# Patient Record
Sex: Female | Born: 1950 | Race: Black or African American | Hispanic: No | Marital: Single | Smoking: Never smoker
Health system: Southern US, Community
[De-identification: ages and names within clinical notes are randomized; demographics above are authoritative.]

## PROBLEM LIST (undated history)

## (undated) HISTORY — PX: ANKLE ARTHROSCOPY WITH OPEN REDUCTION INTERNAL FIXATION (ORIF): SHX5582

---

## 2003-10-04 ENCOUNTER — Emergency Department (HOSPITAL_COMMUNITY): Admission: EM | Admit: 2003-10-04 | Discharge: 2003-10-04 | Payer: Self-pay | Admitting: Family Medicine

## 2004-05-18 ENCOUNTER — Emergency Department (HOSPITAL_COMMUNITY): Admission: EM | Admit: 2004-05-18 | Discharge: 2004-05-18 | Payer: Self-pay | Admitting: Emergency Medicine

## 2005-08-19 ENCOUNTER — Emergency Department (HOSPITAL_COMMUNITY): Admission: EM | Admit: 2005-08-19 | Discharge: 2005-08-19 | Payer: Self-pay | Admitting: Emergency Medicine

## 2005-09-11 ENCOUNTER — Emergency Department (HOSPITAL_COMMUNITY): Admission: EM | Admit: 2005-09-11 | Discharge: 2005-09-11 | Payer: Self-pay | Admitting: Family Medicine

## 2006-10-17 ENCOUNTER — Emergency Department (HOSPITAL_COMMUNITY): Admission: EM | Admit: 2006-10-17 | Discharge: 2006-10-17 | Payer: Self-pay | Admitting: Emergency Medicine

## 2006-10-18 ENCOUNTER — Emergency Department (HOSPITAL_COMMUNITY): Admission: EM | Admit: 2006-10-18 | Discharge: 2006-10-18 | Payer: Self-pay | Admitting: Family Medicine

## 2007-05-09 ENCOUNTER — Emergency Department (HOSPITAL_COMMUNITY): Admission: EM | Admit: 2007-05-09 | Discharge: 2007-05-09 | Payer: Self-pay | Admitting: Emergency Medicine

## 2007-05-13 ENCOUNTER — Observation Stay (HOSPITAL_COMMUNITY): Admission: RE | Admit: 2007-05-13 | Discharge: 2007-05-14 | Payer: Self-pay | Admitting: Orthopaedic Surgery

## 2007-12-02 ENCOUNTER — Ambulatory Visit: Payer: Self-pay | Admitting: Family Medicine

## 2007-12-02 DIAGNOSIS — G43009 Migraine without aura, not intractable, without status migrainosus: Secondary | ICD-10-CM | POA: Insufficient documentation

## 2007-12-02 DIAGNOSIS — I10 Essential (primary) hypertension: Secondary | ICD-10-CM | POA: Insufficient documentation

## 2007-12-15 ENCOUNTER — Ambulatory Visit: Payer: Self-pay | Admitting: Family Medicine

## 2007-12-15 ENCOUNTER — Encounter: Payer: Self-pay | Admitting: Family Medicine

## 2007-12-16 LAB — CONVERTED CEMR LAB
ALT: 18 units/L (ref 0–35)
AST: 20 units/L (ref 0–37)
BUN: 11 mg/dL (ref 6–23)
Calcium: 9.6 mg/dL (ref 8.4–10.5)
Glucose, Bld: 109 mg/dL — ABNORMAL HIGH (ref 70–99)
LDL Cholesterol: 123 mg/dL — ABNORMAL HIGH (ref 0–99)
TSH: 0.963 microintl units/mL (ref 0.350–4.50)
Total CHOL/HDL Ratio: 4.1
Total Protein: 7.7 g/dL (ref 6.0–8.3)
VLDL: 19 mg/dL (ref 0–40)

## 2007-12-19 ENCOUNTER — Telehealth: Payer: Self-pay | Admitting: Family Medicine

## 2007-12-23 ENCOUNTER — Ambulatory Visit: Payer: Self-pay | Admitting: Family Medicine

## 2007-12-23 DIAGNOSIS — E7800 Pure hypercholesterolemia, unspecified: Secondary | ICD-10-CM | POA: Insufficient documentation

## 2008-01-12 ENCOUNTER — Emergency Department (HOSPITAL_COMMUNITY): Admission: EM | Admit: 2008-01-12 | Discharge: 2008-01-12 | Payer: Self-pay | Admitting: Family Medicine

## 2008-01-21 ENCOUNTER — Encounter: Payer: Self-pay | Admitting: Family Medicine

## 2008-01-21 ENCOUNTER — Ambulatory Visit: Payer: Self-pay | Admitting: Family Medicine

## 2008-01-21 LAB — CONVERTED CEMR LAB
CO2: 25 meq/L (ref 19–32)
Chloride: 102 meq/L (ref 96–112)
Creatinine, Ser: 0.96 mg/dL (ref 0.40–1.20)
Potassium: 4 meq/L (ref 3.5–5.3)

## 2008-01-22 ENCOUNTER — Encounter: Payer: Self-pay | Admitting: Family Medicine

## 2008-10-18 ENCOUNTER — Emergency Department (HOSPITAL_COMMUNITY): Admission: EM | Admit: 2008-10-18 | Discharge: 2008-10-18 | Payer: Self-pay | Admitting: Emergency Medicine

## 2008-10-22 ENCOUNTER — Ambulatory Visit: Payer: Self-pay | Admitting: Family Medicine

## 2008-12-17 IMAGING — RF DG ANKLE 2V *R*
1 series · 2 of 2 positions shown · non-contrast
Comparison: none

HISTORY: Right ankle fracture, surgery

[Series 1: run · 2 of 2 slices shown]
[im 1/2]
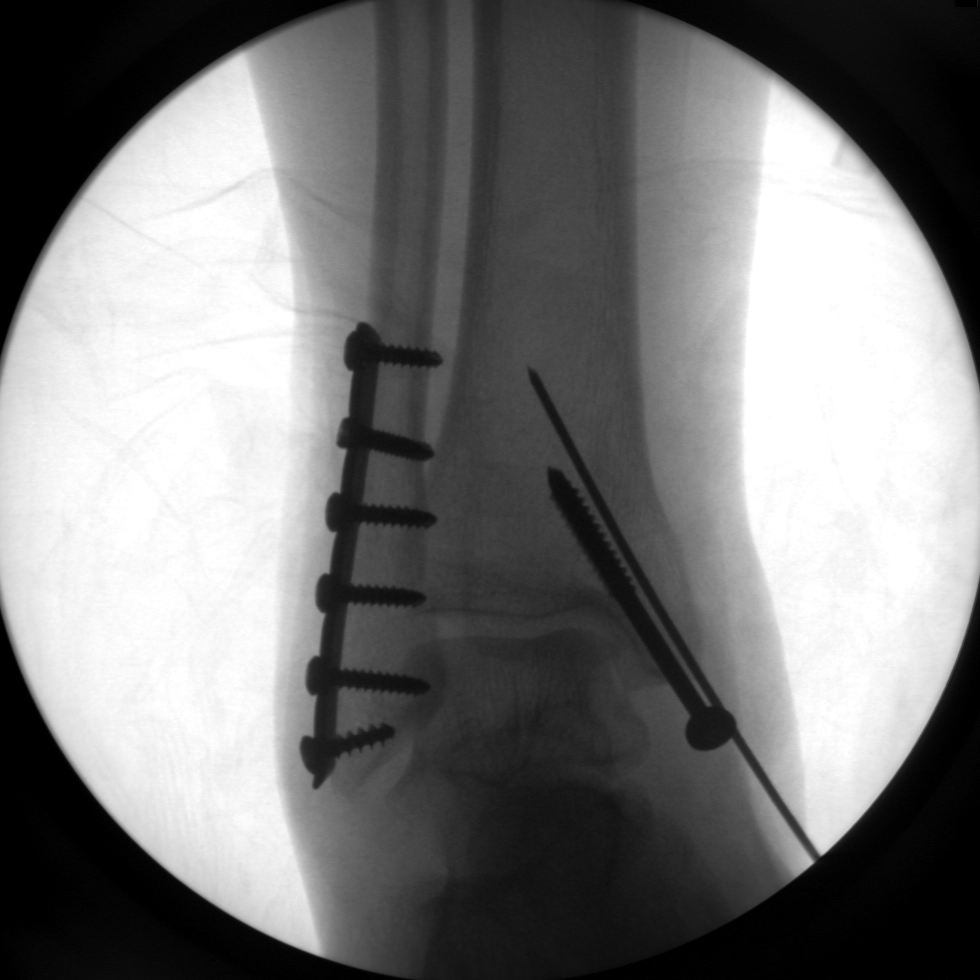
[im 2/2]
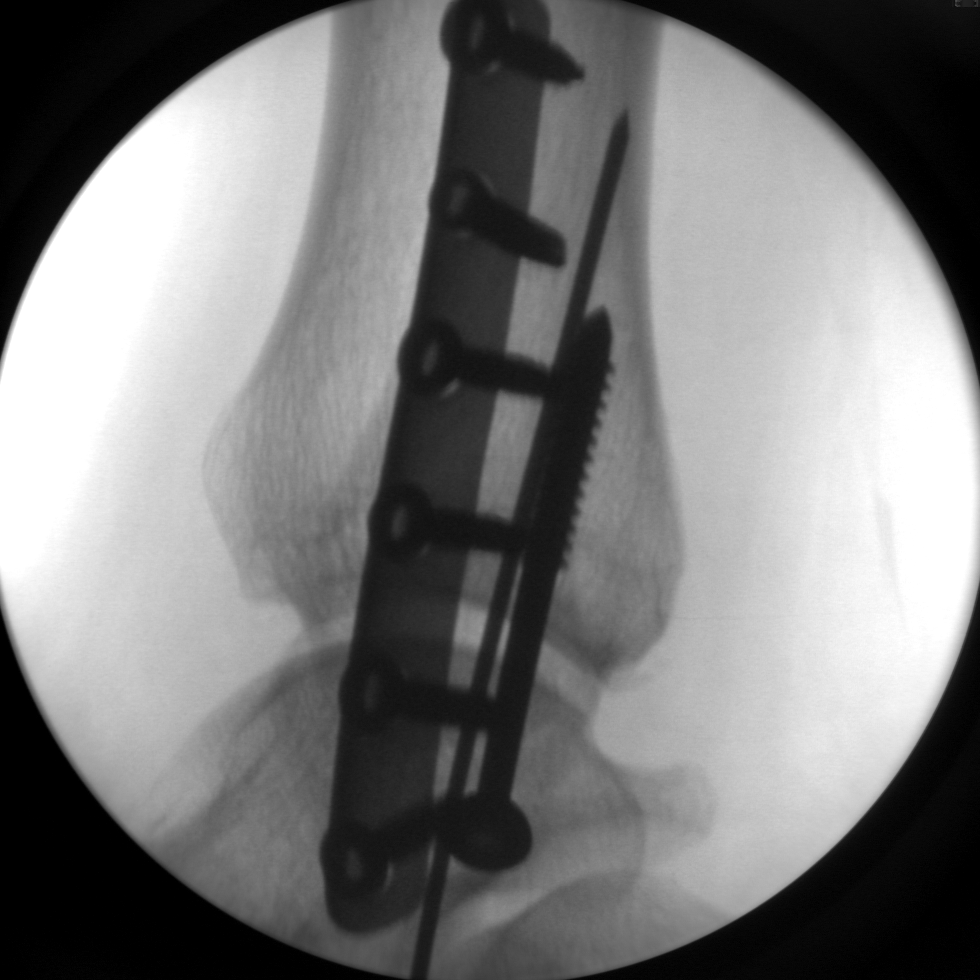

[2 of 2 positions shown; findings below may reference images not displayed]

RIGHT ANKLE 2 VIEWS:

Two digital C-arm fluoroscopic images obtained intraoperatively.
Lateral malleable plate and 6 screws identified at reduced distal fibular
fracture.
Single screw and K-wire present at reduced medial malleolar fracture.
Minimal widening lateral aspect of ankle mortise.
Question mild bony demineralization.
IMPRESSION: ORIF of medial and lateral malleolar fractures right ankle.

## 2010-04-11 ENCOUNTER — Emergency Department (HOSPITAL_COMMUNITY): Admission: EM | Admit: 2010-04-11 | Discharge: 2010-04-11 | Payer: Self-pay | Admitting: Emergency Medicine

## 2010-08-08 LAB — BASIC METABOLIC PANEL
BUN: 6 mg/dL (ref 6–23)
Calcium: 9.3 mg/dL (ref 8.4–10.5)
Chloride: 104 mEq/L (ref 96–112)
Creatinine, Ser: 0.92 mg/dL (ref 0.4–1.2)
GFR calc Af Amer: >60 mL/min (ref >60)
Potassium: 3.8 mEq/L (ref 3.5–5.1)

## 2010-09-05 LAB — BASIC METABOLIC PANEL
Chloride: 105 mEq/L (ref 96–112)
Creatinine, Ser: 0.68 mg/dL (ref 0.4–1.2)
GFR calc Af Amer: >60 mL/min (ref >60)
GFR calc non Af Amer: >60 mL/min (ref >60)
Potassium: 3.7 mEq/L (ref 3.5–5.1)

## 2010-10-10 NOTE — Op Note (Signed)
Cynthia Allison, Cynthia Allison             ACCOUNT NO.:  [id number]   MEDICAL RECORD NO.:  [id number]          PATIENT TYPE:  OBV   LOCATION:  A340                          FACILITY:  [facility]   PHYSICIAN:  J. Darreld Mclean, M.D. DATE OF BIRTH:  07-10-1950   DATE OF PROCEDURE:  05/13/2007  DATE OF DISCHARGE:                               OPERATIVE REPORT   PREOPERATIVE DIAGNOSIS:  Bimalleolar fracture, right ankle.   POSTOPERATIVE DIAGNOSIS:  Bimalleolar fracture, right ankle.   PROCEDURE:  Open treatment and internal reduction, right ankle  bimalleolar fracture.   ANESTHESIA:  General.   SURGEON:  J. Darreld Mclean, M.D.   DRAINS:  None.   Posterior splint applied.   TOURNIQUET TIME:  36 minutes.   INDICATIONS FOR PROCEDURE:  The patient is a 60 year old female who fell  late Thursday night/early Friday morning of last week injuring her right  ankle.  I saw her in the office Friday and again yesterday, Monday.  She  has slight opening of the mortise and movement laterally of the fracture  and surgery is recommended.  The risks and imponderables were discussed  preoperatively, she appeared to understand, and agreed with the  procedure as outlined.   DESCRIPTION OF PROCEDURE:  The patient was seen in the holding area, the  right ankle was identified as the correct surgical site, and Ms.  Scalia placed a mark on the right ankle and I placed a mark on the  right ankle.  She was brought back to the operating room and given  general anesthesia while supine on the operating table.  A tourniquet  was placed deflated about the right upper thigh.  She was prepped and  draped in the usual manner.  We had a time out identifying the patient  as [name] and the ankle on the right as the correct surgical site.  The leg was elevated, wrapped circumferentially with an Esmarch bandage,  the tourniquet inflated to 300 mmHg, and the Esmarch bandage removed.   An incision was made medially and  the fracture was identified and  reduced anatomically.  A 0.062 smooth Kirschner wire placed and a 55 mm  malleolar screw was placed after a 3.2 drill.  Attention was directed to  the lateral side.  The fracture was reduced anatomically and a six hole  sideplate locking was used.  We used a cancellous screw distally and a  locking screw for the second screw proximally, and the rest of the  screws were cortical.  We had good fixation.  X-ray showed reduction of  the fracture by the C-arm fluoroscopy unit and restoration of the  mortise.  The  wounds on both sides were reapproximated with 2-0 chromic, 2-0 plain,  and skin staples.  Sterile dressings were applied bilaterally, then a  bulky dressing applied, and a posterior splint applied.  The patient  tolerated the procedure well and went to the recovery room in good  condition.  She will be admitted overnight for observation.           ______________________________  [name]. Darreld Mclean, M.D.  JWK/MEDQ  D:  05/13/2007  T:  05/13/2007  Job:  [id number]

## 2010-10-10 NOTE — H&P (Signed)
Cynthia Allison, Cynthia Allison             ACCOUNT NO.:  [id number]   MEDICAL RECORD NO.:  [id number]          PATIENT TYPE:  AMB   LOCATION:  DAY                           FACILITY:  [facility]   PHYSICIAN:  J. Darreld Mclean, M.D. DATE OF BIRTH:  1951-03-29   DATE OF ADMISSION:  DATE OF DISCHARGE:  LH                              HISTORY & PHYSICAL   CHIEF COMPLAINT:  I broke my ankle.   The patient fell late Thursday, May 08, 2007, and was seen in the  ER on May 09, 2007.  She has a bimalleolar fracture on the right.  It was slightly displaced.  I saw her in the office later in the day on  May 09, 2007.  I advised her that the fracture sort of borderlined,  that it might slip and need surgery.  I saw her in the office on  May 12, 2007.  The fracture slightly opened up with medial  malleolus opened up and shifted laterally.  She has a fracture,  essentially nondisplaced, of the medial malleolus and a fracture with  some displacement of the lateral malleolus on the right.  There were no  other injuries.  I have advised surgery.  I have gone over the risks and  imponderables.  She appears to understand.   PAST HISTORY6:  Hypertension.   ALLERGIES:  She has no allergies.   SOCIAL HISTORY:  She does not smoke.  She uses alcohol socially.   She was given Hydrocodone in the ER and it is controlling her pain.  The  patient has no family doctor.  She never had surgery.  Hypertension runs  in the family.  She lives in [place] and is single.   PHYSICAL EXAMINATION:  VITAL SIGNS:  Blood pressure is 192/100, pulse  88, respirations 16, afebrile.  She was unable to stand on the scales  because of her fracture.  She states she is 5 feet 3 inches and weighs  around 165 pounds.  GENERAL:  The patient is alert, oriented and cooperative.  HEENT:  Negative.  NECK:  Supple.  LUNGS:  Clear to percussion and auscultation.  HEART:  Regular rate and rhythm without murmur heard.  ABDOMEN:   Soft, nontender, without masses.  Right leg in a posterior  splint with some swelling.  NEUROVASCULAR:  Intact.  EXTREMITIES:  Other extremities are negative.  CNS:  Intact.  SKIN:  Intact.   IMPRESSION:  Bimalleolar fracture on the right with widening of the  mortise.   PLAN:  To the OR for the right ankle.  I have gone over the procedure,  risks and imponderables with the patient.  She appears to understand.  Labs are pending.                                            ______________________________  J. Darreld Mclean, M.D.     JWK/MEDQ  D:  05/12/2007  T:  05/12/2007  Job:  [id number]

## 2011-03-02 LAB — COMPREHENSIVE METABOLIC PANEL
ALT: 14
AST: 16
Albumin: 3.6
Creatinine, Ser: 0.67
Sodium: 138
Total Protein: 7.1

## 2011-03-02 LAB — URINALYSIS, ROUTINE W REFLEX MICROSCOPIC
Nitrite: NEGATIVE
Protein, ur: NEGATIVE
Specific Gravity, Urine: >1.03 — ABNORMAL HIGH

## 2011-03-02 LAB — DIFFERENTIAL
Basophils Relative: 1
Eosinophils Absolute: 0.3
Eosinophils Relative: 3
Lymphocytes Relative: 34
Lymphs Abs: 3.3
Monocytes Relative: 7

## 2011-03-02 LAB — CBC
Hemoglobin: 11.7 — ABNORMAL LOW
MCV: 84.4
Platelets: 452 — ABNORMAL HIGH

## 2014-03-29 ENCOUNTER — Telehealth: Payer: Self-pay

## 2014-03-30 NOTE — Telephone Encounter (Signed)
Opened in error

## 2017-06-18 ENCOUNTER — Emergency Department (HOSPITAL_COMMUNITY)
Admission: EM | Admit: 2017-06-18 | Discharge: 2017-06-18 | Disposition: A | Discharge status: Home / Self Care | Payer: 59 | Attending: Emergency Medicine | Admitting: Emergency Medicine

## 2017-06-18 ENCOUNTER — Emergency Department (HOSPITAL_COMMUNITY): Payer: 59

## 2017-06-18 ENCOUNTER — Other Ambulatory Visit: Payer: Self-pay

## 2017-06-18 ENCOUNTER — Encounter (HOSPITAL_COMMUNITY): Payer: Self-pay | Admitting: Emergency Medicine

## 2017-06-18 DIAGNOSIS — S42402A Unspecified fracture of lower end of left humerus, initial encounter for closed fracture: Secondary | ICD-10-CM | POA: Insufficient documentation

## 2017-06-18 DIAGNOSIS — Y939 Activity, unspecified: Secondary | ICD-10-CM | POA: Insufficient documentation

## 2017-06-18 DIAGNOSIS — X58XXXA Exposure to other specified factors, initial encounter: Secondary | ICD-10-CM | POA: Insufficient documentation

## 2017-06-18 DIAGNOSIS — Y929 Unspecified place or not applicable: Secondary | ICD-10-CM | POA: Diagnosis not present

## 2017-06-18 DIAGNOSIS — I10 Essential (primary) hypertension: Secondary | ICD-10-CM | POA: Diagnosis not present

## 2017-06-18 DIAGNOSIS — W19XXXA Unspecified fall, initial encounter: Secondary | ICD-10-CM | POA: Diagnosis not present

## 2017-06-18 DIAGNOSIS — M25522 Pain in left elbow: Secondary | ICD-10-CM | POA: Diagnosis not present

## 2017-06-18 DIAGNOSIS — Y999 Unspecified external cause status: Secondary | ICD-10-CM | POA: Diagnosis not present

## 2017-06-18 DIAGNOSIS — S59902A Unspecified injury of left elbow, initial encounter: Secondary | ICD-10-CM | POA: Diagnosis not present

## 2017-06-18 NOTE — ED Provider Notes (Signed)
[facility] EMERGENCY DEPARTMENT Provider Note   CSN: [id number] Arrival date & time: 06/18/17  1622     History   Chief Complaint Chief Complaint  Patient presents with  . Fall    HPI Cynthia Allison is a 67 y.o. female.  The history is provided by the patient. No language interpreter was used.  Fall  This is a new problem. The current episode started yesterday. The problem occurs constantly. The problem has not changed since onset.Nothing aggravates the symptoms. Nothing relieves the symptoms. She has tried nothing for the symptoms. The treatment provided no relief.  Pt complains of pain in her left elbow after falling down.    History reviewed. No pertinent past medical history.  Patient Active Problem List   Diagnosis Date Noted  . HYPERCHOLESTEROLEMIA 12/23/2007  . MIGRAINE WITHOUT AURA 12/02/2007  . HYPERTENSION, BENIGN ESSENTIAL, UNCONTROLLED 12/02/2007    Past Surgical History:  Procedure Laterality Date  . ANKLE ARTHROSCOPY WITH OPEN REDUCTION INTERNAL FIXATION (ORIF)      OB History    Gravida Para Term Preterm AB Living   1 1 1          SAB TAB Ectopic Multiple Live Births                   Home Medications    Prior to Admission medications   Medication Sig Start Date End Date Taking? Authorizing Provider  hydrochlorothiazide 25 MG tablet Take 25 mg by mouth every morning.      [provider]  lisinopril (PRINIVIL,ZESTRIL) 5 MG tablet Take 5 mg by mouth daily.      [provider]    Family History Family History  Problem Relation Age of Onset  . Cancer Mother   . Heart disease Mother     Social History Social History   Tobacco Use  . Smoking status: Never Smoker  . Smokeless tobacco: Never Used  Substance Use Topics  . Alcohol use: Yes    Comment: occas  . Drug use: No     Allergies   Patient has no known allergies.   Review of Systems Review of Systems  All other systems reviewed and are  negative.    Physical Exam Updated Vital Signs BP (!) 175/96   Pulse 91   Temp 98.5 F (36.9 C) (Oral)   Resp 16   Ht 5\' 2"  (1.575 m)   Wt 75.8 kg (167 lb)   SpO2 96%   BMI 30.54 kg/m   Physical Exam  Constitutional: She appears well-developed and well-nourished.  HENT:  Head: Normocephalic.  Musculoskeletal: She exhibits tenderness. She exhibits no deformity.  Tender left elbow, slight swelling  nv and ns intact  Neurological: She is alert.  Skin: Skin is warm.  Psychiatric: She has a normal mood and affect.  Nursing note and vitals reviewed.    ED Treatments / Results  Labs (all labs ordered are listed, but only abnormal results are displayed) Labs Reviewed - No data to display  EKG  EKG Interpretation None       Radiology Dg Elbow Complete Left  Result Date: 06/18/2017 CLINICAL DATA:  Posterior left elbow pain after fall. EXAM: LEFT ELBOW - COMPLETE 3+ VIEW COMPARISON:  None. FINDINGS: No acute fracture or malalignment. Borderline elevated anterior fat pad. No displaced posterior fat pad. Joint spaces are preserved. Bone mineralization is normal. Soft tissues are unremarkable. IMPRESSION: Borderline elevated anterior fat pad may reflect a small joint effusion, suggesting possible  occult fracture. Consider further evaluation with follow-up radiographs in 7-10 days or CT. Electronically Signed   By: Obie DredgeWilliam T Derry M.D.   On: 06/18/2017 17:30    Procedures Procedures (including critical care time)  Medications Ordered in ED Medications - No data to display   Initial Impression / Assessment and Plan / ED Course  I have reviewed the triage vital signs and the nursing notes.  Pertinent labs & imaging results that were available during my care of the patient were reviewed by me and considered in my medical decision making (see chart for details).     Pt counseled on possible occult fracture.  Pt advised to see her Orthopaedist for recheck in 1 week.   Pt  placed in long arm posterior splint sling  Final Clinical Impressions(s) / ED Diagnoses   Final diagnoses:  Elbow fracture, left, closed, initial encounter    ED Discharge Orders    None    An After Visit Summary was printed and given to the patient.    Osie CheeksSofia, Martina Brodbeck K, PA-C 06/18/17 2058    Mancel BaleWentz, Elliott, MD 06/18/17 [phone]

## 2017-06-18 NOTE — Discharge Instructions (Signed)
See Dr. Hilda LiasKeeling for recheck in 1 week.  Ice to area of swelling.  Tylenol for pain

## 2017-06-18 NOTE — ED Triage Notes (Signed)
Patient reports she slipped and fell on a wet floor. C/O L elbow pain.

## 2017-06-18 NOTE — ED Notes (Signed)
Pt alert & oriented x4, stable gait. Patient  given discharge instructions, paperwork & prescription(s). Patient verbalized understanding. Pt left department w/ no further questions. 

## 2019-01-23 IMAGING — DX DG ELBOW COMPLETE 3+V*L*
4 series · 4 of 4 positions shown · non-contrast
Comparison: None.

CLINICAL DATA: Posterior left elbow pain after fall.

EXAM:
LEFT ELBOW - COMPLETE 3+ VIEW

[elbow ap]
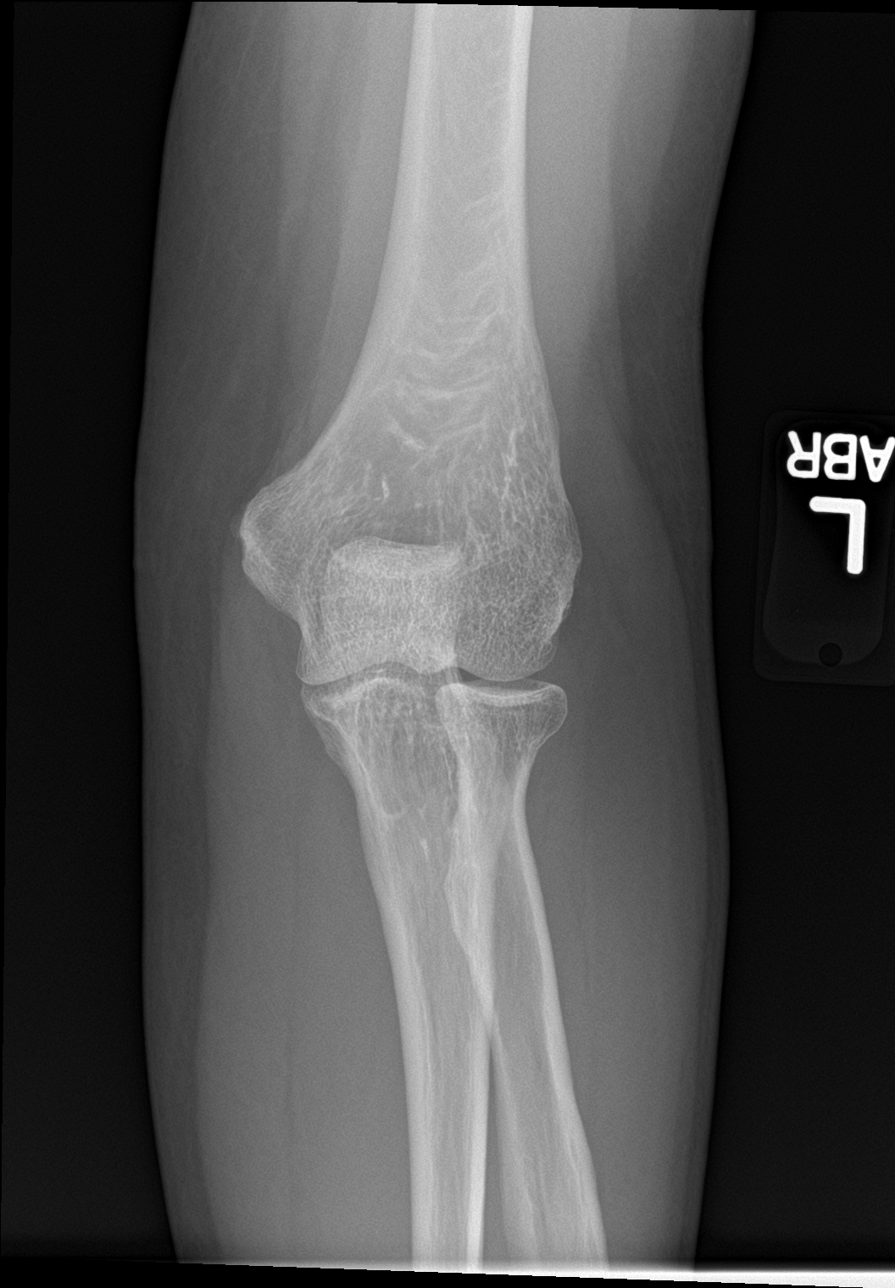

[elbow obl (1 of 2)]
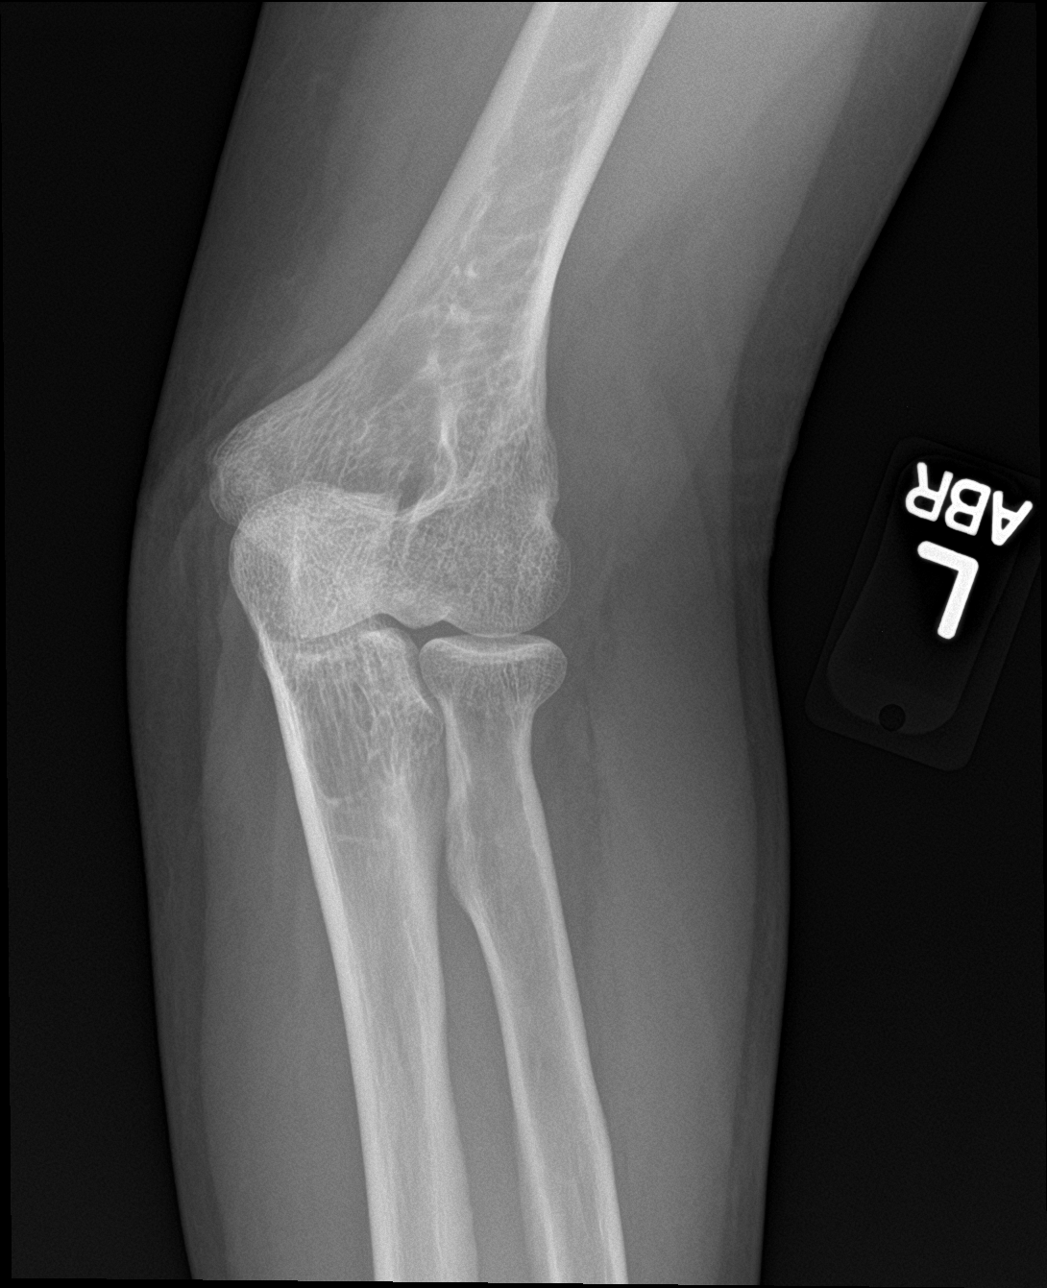

[elbow lat]
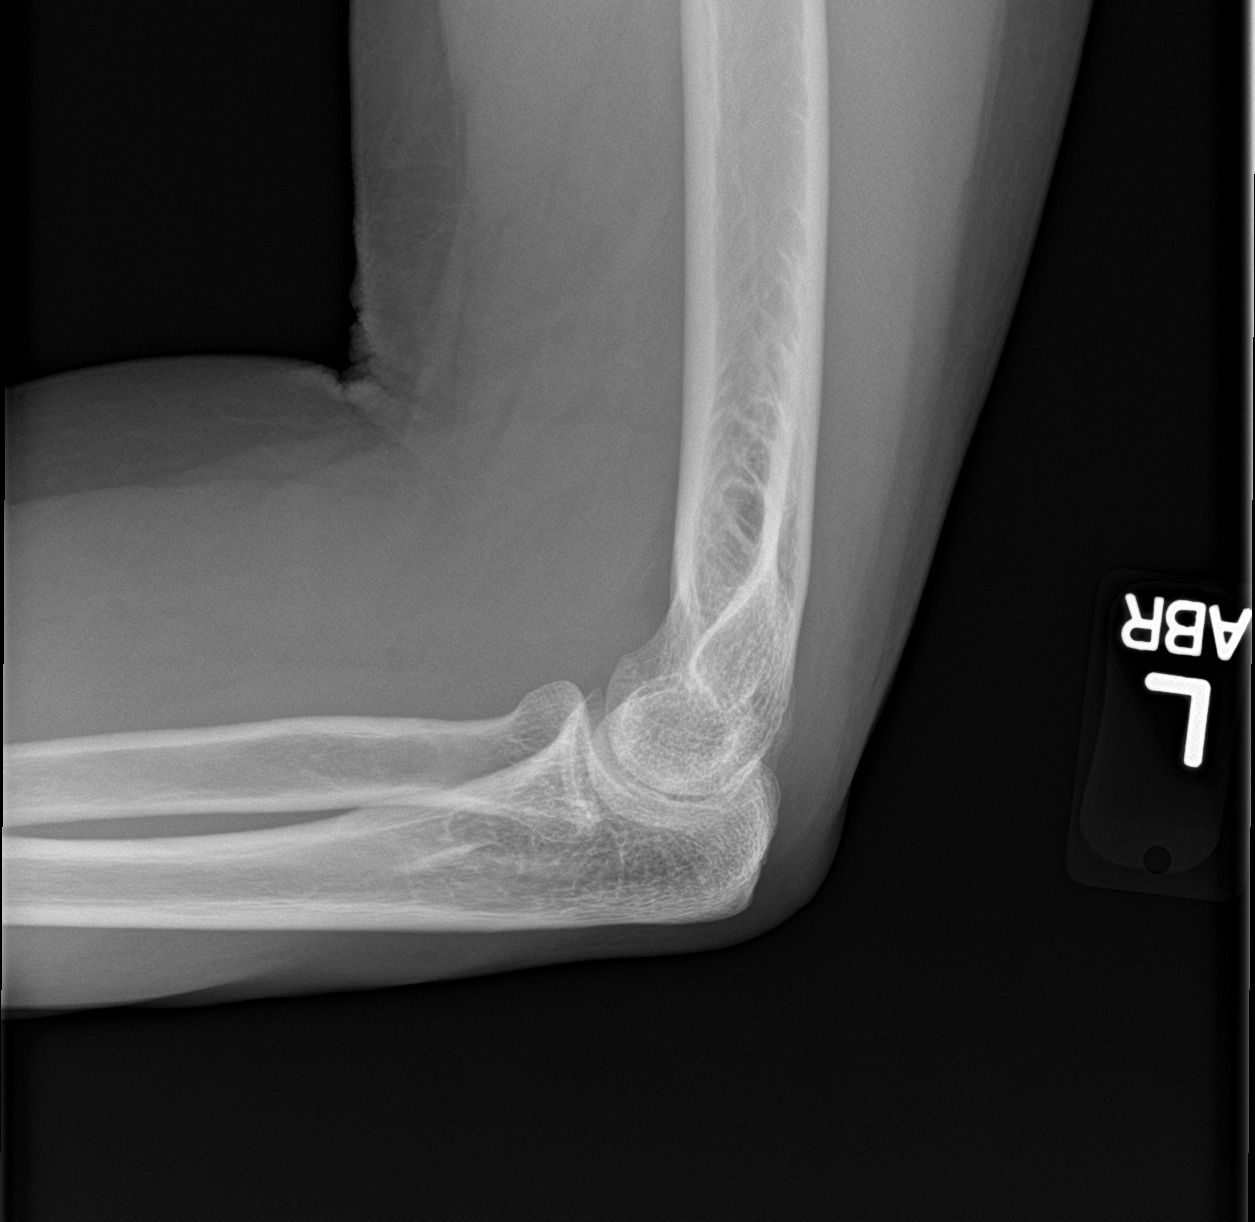

[elbow obl (2 of 2)]
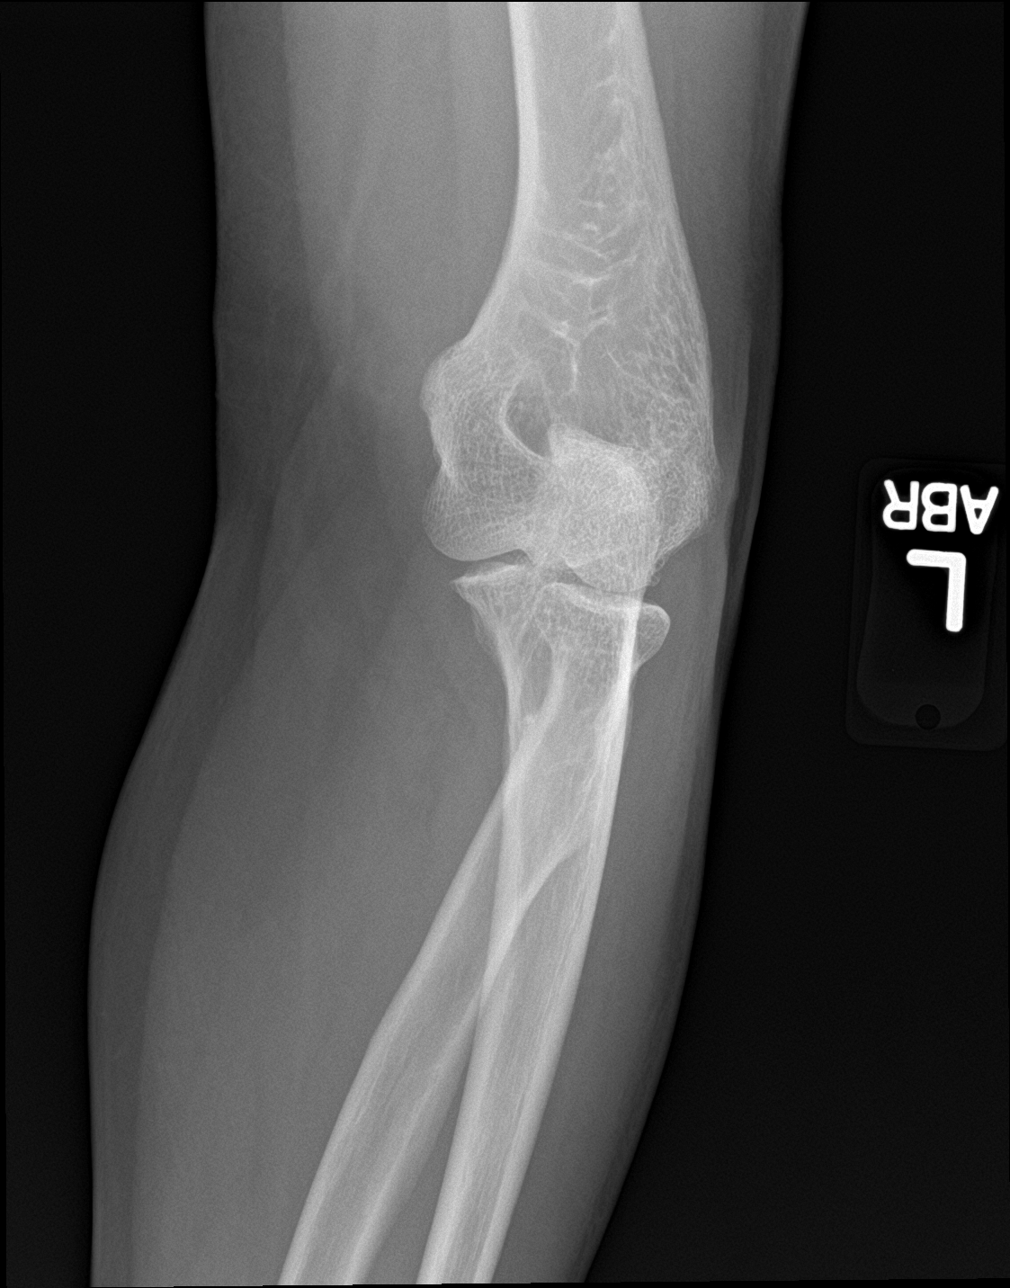

[4 of 4 positions shown; findings below may reference images not displayed]

FINDINGS: No acute fracture or malalignment. Borderline elevated anterior fat
pad. No displaced posterior fat pad. Joint spaces are preserved.
Bone mineralization is normal. Soft tissues are unremarkable.
IMPRESSION: Borderline elevated anterior fat pad may reflect a small joint
effusion, suggesting possible occult fracture. Consider further
evaluation with follow-up radiographs in 7-10 days or CT.

## 2021-02-23 ENCOUNTER — Ambulatory Visit
Admission: EM | Admit: 2021-02-23 | Discharge: 2021-02-23 | Disposition: A | Discharge status: Home / Self Care | Payer: 59 | Attending: Emergency Medicine | Admitting: Emergency Medicine

## 2021-02-23 ENCOUNTER — Encounter: Payer: Self-pay | Admitting: Emergency Medicine

## 2021-02-23 ENCOUNTER — Other Ambulatory Visit: Payer: Self-pay

## 2021-02-23 DIAGNOSIS — S39012A Strain of muscle, fascia and tendon of lower back, initial encounter: Secondary | ICD-10-CM

## 2021-02-23 MED ORDER — METHOCARBAMOL 500 MG PO TABS: 500.0000 mg | ORAL_TABLET | Freq: Four times a day (QID) | ORAL | 0 refills | Status: AC

## 2021-02-23 NOTE — ED Triage Notes (Signed)
Lower back pain with movement that started Monday while at work.  Does frequent lifting at work.

## 2021-02-23 NOTE — ED Provider Notes (Signed)
[facility]    CSN: [id number] Arrival date & time: 02/23/21  1159      History   Chief Complaint No chief complaint on file.   HPI Cynthia Allison is a 70 y.o. female.   Pt. C/o low back pain for 4 days. She states that pain is sharp worse with prolonged sitting and lying. No changes in bladder or bowel. No radiating pain.     History reviewed. No pertinent past medical history.  Patient Active Problem List   Diagnosis Date Noted   HYPERCHOLESTEROLEMIA 12/23/2007   MIGRAINE WITHOUT AURA 12/02/2007   HYPERTENSION, BENIGN ESSENTIAL, UNCONTROLLED 12/02/2007    Past Surgical History:  Procedure Laterality Date   ANKLE ARTHROSCOPY WITH OPEN REDUCTION INTERNAL FIXATION (ORIF)      OB History     Gravida  1   Para  1   Term  1   Preterm      AB      Living         SAB      IAB      Ectopic      Multiple      Live Births               Home Medications    Prior to Admission medications   Medication Sig Start Date End Date Taking? Authorizing Provider  hydrochlorothiazide 25 MG tablet Take 25 mg by mouth every morning.      [provider]  lisinopril (PRINIVIL,ZESTRIL) 5 MG tablet Take 5 mg by mouth daily.      [provider]    Family History Family History  Problem Relation Age of Onset   Cancer Mother    Heart disease Mother     Social History Social History   Tobacco Use   Smoking status: Never   Smokeless tobacco: Never  Vaping Use   Vaping Use: Never used  Substance Use Topics   Alcohol use: Yes    Comment: occas   Drug use: No     Allergies   Patient has no known allergies.   Review of Systems Review of Systems  Cardiovascular: Negative.   Gastrointestinal: Negative.   Genitourinary: Negative.   Musculoskeletal:  Positive for arthralgias and myalgias.    Physical Exam Triage Vital Signs ED Triage Vitals  Enc Vitals Group     BP 02/23/21 1222 (!) 195/95     Pulse Rate  02/23/21 1221 99     Resp 02/23/21 1221 18     Temp 02/23/21 1221 98 F (36.7 C)     Temp Source 02/23/21 1221 Oral     SpO2 02/23/21 1221 96 %     Weight --      Height --      Head Circumference --      Peak Flow --      Pain Score 02/23/21 1221 7     Pain Loc --      Pain Edu? --      Excl. in GC? --    No data found.  Updated Vital Signs BP (!) 195/95 (BP Location: Right Arm)   Pulse 99   Temp 98 F (36.7 C) (Oral)   Resp 18   SpO2 96%   Visual Acuity Right Eye Distance:   Left Eye Distance:   Bilateral Distance:    Right Eye Near:   Left Eye Near:    Bilateral Near:     Physical Exam Constitutional:  General: She is not in acute distress.    Appearance: She is normal weight. She is not ill-appearing, toxic-appearing or diaphoretic.  Cardiovascular:     Rate and Rhythm: Normal rate.     Heart sounds: No murmur heard.   No friction rub. No gallop.  Pulmonary:     Effort: Pulmonary effort is normal. No respiratory distress.     Breath sounds: Normal breath sounds. No stridor. No wheezing, rhonchi or rales.  Chest:     Chest wall: No tenderness.  Musculoskeletal:     Comments: TTP Paraspinal muscles of L-spine SLR negative bilaterally gait stable  Neurological:     Mental Status: She is alert.     UC Treatments / Results  Labs (all labs ordered are listed, but only abnormal results are displayed) Labs Reviewed - No data to display  EKG   Radiology No results found.  Procedures Procedures (including critical care time)  Medications Ordered in UC Medications - No data to display  Initial Impression / Assessment and Plan / UC Course  I have reviewed the triage vital signs and the nursing notes.  Pertinent labs & imaging results that were available during my care of the patient were reviewed by me and considered in my medical decision making (see chart for details).      Final Clinical Impressions(s) / UC Diagnoses   Final diagnoses:   None   Discharge Instructions   None    ED Prescriptions   None    PDMP not reviewed this encounter.   Faythe Casa [place], [place] 02/23/21 1238

## 2021-07-02 ENCOUNTER — Encounter (HOSPITAL_COMMUNITY): Payer: Self-pay | Admitting: *Deleted

## 2021-07-02 ENCOUNTER — Other Ambulatory Visit: Payer: Self-pay

## 2021-07-02 ENCOUNTER — Ambulatory Visit: Admission: EM | Admit: 2021-07-02 | Discharge: 2021-07-02 | Payer: 59 | Attending: Urgent Care | Admitting: Urgent Care

## 2021-07-02 ENCOUNTER — Encounter: Payer: Self-pay | Admitting: Emergency Medicine

## 2021-07-02 ENCOUNTER — Emergency Department (HOSPITAL_COMMUNITY)
Admission: EM | Admit: 2021-07-02 | Discharge: 2021-07-02 | Disposition: A | Discharge status: Home / Self Care | Payer: 59 | Attending: Emergency Medicine | Admitting: Emergency Medicine

## 2021-07-02 DIAGNOSIS — I10 Essential (primary) hypertension: Secondary | ICD-10-CM

## 2021-07-02 DIAGNOSIS — I160 Hypertensive urgency: Secondary | ICD-10-CM | POA: Diagnosis not present

## 2021-07-02 DIAGNOSIS — R42 Dizziness and giddiness: Secondary | ICD-10-CM

## 2021-07-02 LAB — BASIC METABOLIC PANEL
Anion gap: 12 (ref 5–15)
BUN: 16 mg/dL (ref 8–23)
CO2: 25 mmol/L (ref 22–32)
Calcium: 9.6 mg/dL (ref 8.9–10.3)
Chloride: 100 mmol/L (ref 98–111)
Creatinine, Ser: 0.73 mg/dL (ref 0.44–1.00)
GFR, Estimated: >60 mL/min (ref >60)
Glucose, Bld: 116 mg/dL — ABNORMAL HIGH (ref 70–99)
Potassium: 3.4 mmol/L — ABNORMAL LOW (ref 3.5–5.1)
Sodium: 137 mmol/L (ref 135–145)

## 2021-07-02 LAB — CBC
HCT: 41.7 % (ref 36.0–46.0)
Hemoglobin: 12.9 g/dL (ref 12.0–15.0)
MCH: 27.9 pg (ref 26.0–34.0)
MCHC: 30.9 g/dL (ref 30.0–36.0)
MCV: 90.1 fL (ref 80.0–100.0)
Platelets: 423 10*3/uL — ABNORMAL HIGH (ref 150–400)
RBC: 4.63 MIL/uL (ref 3.87–5.11)
RDW: 14.1 % (ref 11.5–15.5)
WBC: 7.9 10*3/uL (ref 4.0–10.5)
nRBC: 0 % (ref 0.0–0.2)

## 2021-07-02 MED ORDER — METOPROLOL TARTRATE 50 MG PO TABS
25.0000 mg | ORAL_TABLET | Freq: Two times a day (BID) | ORAL | 1 refills | Status: AC
Start: 2021-07-02 — End: ?

## 2021-07-02 MED ORDER — METOPROLOL TARTRATE 50 MG PO TABS
50.0000 mg | ORAL_TABLET | Freq: Once | ORAL | Status: AC
  Administered 2021-07-02: 50 mg via ORAL
  Filled 2021-07-02: qty 1

## 2021-07-02 NOTE — ED Notes (Signed)
Patient is being discharged from the Urgent Care and sent to the Emergency Department via private vehicle . Per PA, patient is in need of higher level of care due to hypertensive crisis. Patient is aware and verbalizes understanding of plan of care.  Vitals:   07/02/21 1525  BP: (!) 230/92  Pulse: 86  Resp: 18  Temp: 98.1 F (36.7 C)  SpO2: 96%

## 2021-07-02 NOTE — ED Triage Notes (Signed)
States she was referred by urgent care for elevated blood pressure

## 2021-07-02 NOTE — ED Provider Notes (Signed)
[facility]   MRN: [id number] DOB: August 01, 1950  Subjective:   Cynthia Allison is a 71 y.o. female presenting for 4-day history of acute onset persistent intermittent dizziness, difficulty with her balance.  She has started to have some cramping in her lower legs as well.  No headache, confusion, vision changes, neck pain, diaphoresis, chest pain, heart racing, palpitations, nausea, vomiting, abdominal pain, hematuria.  Patient has a longstanding history of hypertension and is not currently taking any medications for her blood pressure.  She is not a smoker.  No history of heart disease.  No history of stroke.  No current facility-administered medications for this encounter.  Current Outpatient Medications:    hydrochlorothiazide 25 MG tablet, Take 25 mg by mouth every morning.  , Disp: , Rfl:    lisinopril (PRINIVIL,ZESTRIL) 5 MG tablet, Take 5 mg by mouth daily.  , Disp: , Rfl:    No Known Allergies  History reviewed. No pertinent past medical history.   Past Surgical History:  Procedure Laterality Date   ANKLE ARTHROSCOPY WITH OPEN REDUCTION INTERNAL FIXATION (ORIF)      Family History  Problem Relation Age of Onset   Cancer Mother    Heart disease Mother     Social History   Tobacco Use   Smoking status: Never   Smokeless tobacco: Never  Vaping Use   Vaping Use: Never used  Substance Use Topics   Alcohol use: Yes    Comment: occas   Drug use: No    ROS   Objective:   Vitals: BP (!) 230/92 (BP Location: Right Arm)    Pulse 86    Temp 98.1 F (36.7 C) (Oral)    Resp 18    SpO2 96%   BP Readings from Last 3 Encounters:  07/02/21 (!) 230/92  02/23/21 (!) 195/95  06/18/17 (!) 175/96   Physical Exam Constitutional:      General: She is not in acute distress.    Appearance: Normal appearance. She is well-developed. She is not ill-appearing, toxic-appearing or diaphoretic.  HENT:     Head: Normocephalic and atraumatic.     Nose: Nose  normal.     Mouth/Throat:     Mouth: Mucous membranes are moist.  Eyes:     General: No scleral icterus.       Right eye: No discharge.        Left eye: No discharge.     Extraocular Movements: Extraocular movements intact.  Cardiovascular:     Rate and Rhythm: Normal rate.     Heart sounds: No murmur heard.   No friction rub. No gallop.  Pulmonary:     Effort: Pulmonary effort is normal. No respiratory distress.     Breath sounds: No stridor. No wheezing, rhonchi or rales.  Chest:     Chest wall: No tenderness.  Skin:    General: Skin is warm and dry.  Neurological:     General: No focal deficit present.     Mental Status: She is alert and oriented to person, place, and time.     Cranial Nerves: No cranial nerve deficit.     Motor: No weakness.     Coordination: Coordination normal.     Gait: Gait normal.     Comments: Negative Romberg and pronator drift.  No facial asymmetry.  Psychiatric:        Mood and Affect: Mood normal.        Behavior: Behavior normal.  Thought Content: Thought content normal.        Judgment: Judgment normal.    ED ECG REPORT   Date: 07/02/2021  EKG Time: 3:53 PM  Rate: 76bpm  Rhythm: normal sinus rhythm,  there are no previous tracings available for comparison  Axis: Normal  Intervals:none  ST&T Change: T wave flattening in lead II, 3, aVF  Narrative Interpretation: Sinus rhythm at 76 bpm with nonspecific T wave changes.  No previous EKG available for comparison.      Assessment and Plan :   PDMP not reviewed this encounter.  1. Hypertensive urgency   2. Dizziness   3. Essential hypertension     Concern is for an acute encephalopathy related to her severely uncontrolled hypertension.  No acute findings on EKG.  No signs of active stroke on exam.  Recommended further evaluation and intervention through the hospital. Will have patient go by personal vehicle as opposed to EMS.  She is in agreement, contracts for safety and will head  there now.   Jaynee Eagles, [place] 07/02/21 1555

## 2021-07-02 NOTE — ED Provider Notes (Signed)
[facility] EMERGENCY DEPARTMENT Provider Note   CSN: [id number] Arrival date & time: 07/02/21  1558     History  Chief Complaint  Patient presents with   Hypertension    Cynthia Allison is a 71 y.o. female.   Hypertension   This patient is a 71 year old female, she reports to me that she has no chronic medical conditions, she does report that she was prescribed blood pressure medication about 10 years ago but does not take it, she has been told that she did not need it according to her report.  She does go for her yearly physical exam with her primary care doctor, she has not been updated on those medications since about 10 years ago.  Medical record review shows that this should have been hydrochlorothiazide and lisinopril.  The patient denies taking any chronic daily medications, she does not smoke cigarettes, she drinks the occasional beer but not very often, she takes no over-the-counter medications either.  She reports that starting about 4 days ago when she was driving home from work she started to feel little bit off balance and dizzy, her vision felt a little bit blurred intermittently, she is not having any of those symptoms at this time.  She reports it seems to be at night, seems to be when she is coming home from work.  She denies nausea, vomiting, diarrhea, chest pain, shortness of breath, coughing, sore throat, swelling of the legs, numbness, weakness and has no abdominal pain back pain neck pain or any other symptoms.  In fact at this time she is completely symptom-free and was able to drive herself here.  She had gone to the urgent care today where she was found to be severely hypertensive and was redirected to the emergency department for further evaluation.  Home Medications Prior to Admission medications   Medication Sig Start Date End Date Taking? Authorizing Provider  metoprolol tartrate (LOPRESSOR) 50 MG tablet Take 0.5 tablets (25 mg total) by mouth 2 (two)  times daily. 07/02/21  Yes Noemi Chapel, MD      Allergies    Patient has no known allergies.    Review of Systems   Review of Systems  All other systems reviewed and are negative.  Physical Exam Updated Vital Signs BP (!) 170/80    Pulse 73    Temp 98.1 F (36.7 C)    Resp 16    SpO2 97%  Physical Exam Vitals and nursing note reviewed.  Constitutional:      General: She is not in acute distress.    Appearance: She is well-developed.  HENT:     Head: Normocephalic and atraumatic.     Mouth/Throat:     Pharynx: No oropharyngeal exudate.  Eyes:     General: No scleral icterus.       Right eye: No discharge.        Left eye: No discharge.     Conjunctiva/sclera: Conjunctivae normal.     Pupils: Pupils are equal, round, and reactive to light.  Neck:     Thyroid: No thyromegaly.     Vascular: No JVD.  Cardiovascular:     Rate and Rhythm: Normal rate and regular rhythm.     Heart sounds: Normal heart sounds. No murmur heard.   No friction rub. No gallop.  Pulmonary:     Effort: Pulmonary effort is normal. No respiratory distress.     Breath sounds: Normal breath sounds. No wheezing or rales.  Abdominal:  General: Bowel sounds are normal. There is no distension.     Palpations: Abdomen is soft. There is no mass.     Tenderness: There is no abdominal tenderness.  Musculoskeletal:        General: No tenderness. Normal range of motion.     Cervical back: Normal range of motion and neck supple.  Lymphadenopathy:     Cervical: No cervical adenopathy.  Skin:    General: Skin is warm and dry.     Findings: No erythema or rash.  Neurological:     Mental Status: She is alert.     Coordination: Coordination normal.     Comments: Neurologic exam:  Speech clear, pupils equal round reactive to light, extraocular movements intact  Normal peripheral visual fields Cranial nerves III through XII normal including no facial droop Follows commands, moves all extremities x4, normal  strength to bilateral upper and lower extremities at all major muscle groups including grip Sensation normal to light touch and pinprick Coordination intact, no limb ataxia, finger-nose-finger normal, heel shin normal bilaterally Rapid alternating movements normal No pronator drift Gait normal Can heal and toe walk without weakness.    Psychiatric:        Behavior: Behavior normal.    ED Results / Procedures / Treatments   Labs (all labs ordered are listed, but only abnormal results are displayed) Labs Reviewed  BASIC METABOLIC PANEL - Abnormal; Notable for the following components:      Result Value   Potassium 3.4 (*)    Glucose, Bld 116 (*)    All other components within normal limits  CBC - Abnormal; Notable for the following components:   Platelets 423 (*)    All other components within normal limits    EKG EKG Interpretation  Date/Time:  Sunday July 02 2021 16:18:14 EST Ventricular Rate:  83 PR Interval:  141 QRS Duration: 79 QT Interval:  387 QTC Calculation: 455 R Axis:   32 Text Interpretation: Sinus rhythm Probable left atrial enlargement Borderline abnrm T, anterolateral leads Baseline wander in lead(s) II V1 since last tracing no significant change Confirmed by Noemi Chapel [phone]) on 07/02/2021 4:22:05 PM  Radiology No results found.  Procedures Procedures    Medications Ordered in ED Medications  metoprolol tartrate (LOPRESSOR) tablet 50 mg (50 mg Oral Given 07/02/21 1630)    ED Course/ Medical Decision Making/ A&P                           Medical Decision Making Amount and/or Complexity of Data Reviewed Labs: ordered. ECG/medicine tests: ordered.  Risk Prescription drug management.   This patient presents to the ED for concern of severe hypertension, this involves an extensive number of treatment options, and is a complaint that carries with it a high risk of complications and morbidity.  The differential diagnosis includes severe  uncontrolled hypertension, renal disorder, could be related to over-the-counter drugs but the patient does not take any, would also consider thyroid disease but the patient is not tachycardic febrile or altered.  In fact this patient is not encephalopathic at all, she is not altered, she has a totally normal mental status and neurologic exam.  I suspect that she has some hypertension which has been present for quite some time especially given her prior vital signs which I have reviewed going back a couple of years.   Co morbidities that complicate the patient evaluation  Probably has baseline underlying hypertension  Additional history obtained:  Additional history obtained from medical record including urgent care visit and prior vital signs going back a couple of years External records from outside source obtained and reviewed including vital signs as above   Lab Tests:  I Ordered, and personally interpreted labs.  The pertinent results include: CBC and metabolic panel showing normal metabolic panel and CBC, totally unremarkable   Cardiac Monitoring:  The patient was maintained on a cardiac monitor.  I personally viewed and interpreted the cardiac monitored which showed an underlying rhythm of: Normal sinus rhythm, EKG also seen and interpreted, shows findings consistent without left ventricular hypertrophy, she has normal axis, normal intervals, normal ST segments, normal T waves, no arrhythmia.   Medicines ordered and prescription drug management:  I ordered medication including Metoprolol  for Hypertension  Reevaluation of the patient after these medicines showed that the patient improved I have reviewed the patients home medicines and have made adjustments as needed   Test Considered:  CT head -neurologically unremarkable at this time.   Critical Interventions:  Starting antihypertensives, ensuring no renal dysfunction, intracranial cardiac dysfunction based on  EKG    Problem List / ED Course:  Hypertension, meds given, meds started, referral back to primary care doctor given Labs unremarkable.   Reevaluation:  After the interventions noted above, I reevaluated the patient and found that they have :improved, the patient remains asymptomatic   Social Determinants of Health:  None   Dispostion:  After consideration of the diagnostic results and the patients response to treatment, I feel that the patent would benefit from discharge home.    Vitals:   07/02/21 1610 07/02/21 1630 07/02/21 1700  BP: (!) 215/89 (!) 195/98 (!) 170/80  Pulse: 81 80 73  Resp: 18 18 16   Temp: 98.1 F (36.7 C)    SpO2: 100% 98% 97%           Final Clinical Impression(s) / ED Diagnoses Final diagnoses:  Essential hypertension    Rx / DC Orders ED Discharge Orders          Ordered    metoprolol tartrate (LOPRESSOR) 50 MG tablet  2 times daily        07/02/21 1721              Noemi Chapel, MD 07/02/21 1723

## 2021-07-02 NOTE — Discharge Instructions (Addendum)
Your blood test were normal, your blood pressure has improved significantly with the medication metoprolol, you are to take this medication twice a day, you will need to follow-up with your family doctor within 2 weeks for a recheck of your blood pressure.  Make sure that you do call them for an appointment first thing tomorrow morning.  At this time you are stable for discharge, you should return to the emergency department immediately if you develop severe or worsening symptoms including increasing headache, numbness, weakness, chest pain, difficulty breathing, vomiting, difficulty with balance or speech or vision.  If you do not have a family doctor see the list below.  [facility] Primary Care Doctor List    Tula Nakayama, MD. Specialty: [facility] Medicine Contact information: [address], [address] [place] [address]  [phone]   Sallee Lange, MD. Specialty: [facility] Medicine Contact information: [place]  [place] [address]  [phone]   Rosita Fire, MD Specialty: Internal Medicine Contact information: [place] [address]  [phone]   Delphina Cahill, MD. Specialty: Internal Medicine Contact information: [place] [address]  [phone]    [facility] (Dr. Maudie Mercury) Specialty: Family Medicine Contact information: [place] [address]  [phone]   Leslie Andrea, MD. Specialty: [facility] Medicine Contact information: [place] [address]  [phone]   Asencion Noble, MD. Specialty: Internal Medicine Contact information: [address] [address]  [place] [address]  [place]  [address] [place], [place] [address] [phone]  Services [facility] offers a variety of basic health services.  Services include but are not limited to: Blood  pressure checks  Heart rate checks  Blood sugar checks  Urine analysis  Rapid strep tests  Pregnancy tests.  Health education and referrals  People needing more complex services will be directed to a physician online. Using these virtual visits, doctors can evaluate and prescribe medicine and treatments. There will be no medication on-site, though [facility] will help patients fill their prescriptions at little to no cost.

## 2021-07-02 NOTE — Discharge Instructions (Addendum)
Please head straight to the hospital as I am concerned that you are having a problem with your severely elevated blood pressure causing your dizziness. You will likely get bloodwork and a head CT scan done there to make sure you are not having a brain event like a stroke.

## 2021-07-02 NOTE — ED Triage Notes (Signed)
Dizzy spells since Wednesday.  States she has a cramp feeling in the back of her left upper leg.  States she took some "muscle cramp medication" for her leg that did provide some relief.

## 2021-07-08 ENCOUNTER — Other Ambulatory Visit: Payer: Self-pay

## 2021-07-08 ENCOUNTER — Encounter (HOSPITAL_COMMUNITY): Payer: Self-pay

## 2021-07-08 ENCOUNTER — Emergency Department (HOSPITAL_COMMUNITY)
Admission: EM | Admit: 2021-07-08 | Discharge: 2021-07-08 | Disposition: A | Discharge status: Home / Self Care | Payer: 59 | Attending: Emergency Medicine | Admitting: Emergency Medicine

## 2021-07-08 DIAGNOSIS — S76311A Strain of muscle, fascia and tendon of the posterior muscle group at thigh level, right thigh, initial encounter: Secondary | ICD-10-CM | POA: Diagnosis not present

## 2021-07-08 DIAGNOSIS — S76312A Strain of muscle, fascia and tendon of the posterior muscle group at thigh level, left thigh, initial encounter: Secondary | ICD-10-CM

## 2021-07-08 DIAGNOSIS — X58XXXA Exposure to other specified factors, initial encounter: Secondary | ICD-10-CM | POA: Insufficient documentation

## 2021-07-08 DIAGNOSIS — M79605 Pain in left leg: Secondary | ICD-10-CM | POA: Diagnosis present

## 2021-07-08 MED ORDER — MELOXICAM 7.5 MG PO TABS: 7.5000 mg | ORAL_TABLET | Freq: Every day | ORAL | 0 refills | Status: AC

## 2021-07-08 MED ORDER — KETOROLAC TROMETHAMINE 30 MG/ML IJ SOLN
30.0000 mg | Freq: Once | INTRAMUSCULAR | Status: AC
  Administered 2021-07-08: 30 mg via INTRAMUSCULAR
  Filled 2021-07-08: qty 1

## 2021-07-08 MED ORDER — DEXAMETHASONE SODIUM PHOSPHATE 10 MG/ML IJ SOLN
10.0000 mg | Freq: Once | INTRAMUSCULAR | Status: AC
  Administered 2021-07-08: 10 mg via INTRAMUSCULAR
  Filled 2021-07-08: qty 1

## 2021-07-08 NOTE — ED Provider Notes (Signed)
[facility] EMERGENCY DEPARTMENT Provider Note   CSN: [id number] Arrival date & time: 07/08/21  0024     History  Chief Complaint  Patient presents with   Leg Pain    Left posterior thigh pain radiates down leg    Cynthia Allison is a 71 y.o. female.  Patient presents to the emergency department for evaluation of left upper thigh pain.  Patient reports that there is pain in the back part of her thigh that has been present for some days.  Pain worsens when she tries to walk.  She denies direct injury.  Sometimes the area is numb.  She has not experiencing low back pain.  No pain below the knee.  No calf swelling.  No change in bowel or bladder function.      Home Medications Prior to Admission medications   Medication Sig Start Date End Date Taking? Authorizing Provider  meloxicam (MOBIC) 7.5 MG tablet Take 1 tablet (7.5 mg total) by mouth daily. 07/08/21  Yes Tej Murdaugh, Gwenyth Allegra, MD  metoprolol tartrate (LOPRESSOR) 50 MG tablet Take 0.5 tablets (25 mg total) by mouth 2 (two) times daily. 07/02/21   Noemi Chapel, MD      Allergies    Patient has no known allergies.    Review of Systems   Review of Systems  Musculoskeletal:  Negative for back pain.   Physical Exam Updated Vital Signs BP (!) 208/86 Comment: pt reports she just started BP meds less than a week ago   Pulse 85    Temp 98.2 F (36.8 C) (Oral)    Resp 18    Ht 5\' 2"  (1.575 m)    Wt 75.8 kg    SpO2 99%    BMI 30.56 kg/m  Physical Exam Vitals and nursing note reviewed.  Constitutional:      General: She is not in acute distress.    Appearance: She is well-developed.  HENT:     Head: Normocephalic and atraumatic.     Mouth/Throat:     Mouth: Mucous membranes are moist.  Eyes:     General: Vision grossly intact. Gaze aligned appropriately.     Extraocular Movements: Extraocular movements intact.     Conjunctiva/sclera: Conjunctivae normal.  Cardiovascular:     Rate and Rhythm: Normal rate and regular  rhythm.     Pulses: Normal pulses.          Dorsalis pedis pulses are 2+ on the right side and 2+ on the left side.     Heart sounds: Normal heart sounds, S1 normal and S2 normal. No murmur heard.   No friction rub. No gallop.  Pulmonary:     Effort: Pulmonary effort is normal. No respiratory distress.     Breath sounds: Normal breath sounds.  Abdominal:     General: Bowel sounds are normal.     Palpations: Abdomen is soft.     Tenderness: There is no abdominal tenderness. There is no guarding or rebound.     Hernia: No hernia is present.  Musculoskeletal:        General: No swelling.     Cervical back: Full passive range of motion without pain, normal range of motion and neck supple. No spinous process tenderness or muscular tenderness. Normal range of motion.     Lumbar back: No tenderness or bony tenderness. Normal range of motion. Negative right straight leg raise test and negative left straight leg raise test.     Left upper leg: No swelling,  deformity or tenderness.     Right lower leg: No edema.     Left lower leg: No edema.  Skin:    General: Skin is warm and dry.     Capillary Refill: Capillary refill takes less than 2 seconds.     Findings: No ecchymosis, erythema, rash or wound.  Neurological:     General: No focal deficit present.     Mental Status: She is alert and oriented to person, place, and time.     GCS: GCS eye subscore is 4. GCS verbal subscore is 5. GCS motor subscore is 6.     Cranial Nerves: Cranial nerves 2-12 are intact.     Sensory: Sensation is intact.     Motor: Motor function is intact.     Coordination: Coordination is intact.  Psychiatric:        Attention and Perception: Attention normal.        Mood and Affect: Mood normal.        Speech: Speech normal.        Behavior: Behavior normal.    ED Results / Procedures / Treatments   Labs (all labs ordered are listed, but only abnormal results are displayed) Labs Reviewed - No data to  display  EKG None  Radiology No results found.  Procedures Procedures    Medications Ordered in ED Medications  dexamethasone (DECADRON) injection 10 mg (has no administration in time range)  ketorolac (TORADOL) 30 MG/ML injection 30 mg (has no administration in time range)    ED Course/ Medical Decision Making/ A&P                           Medical Decision Making  Patient presents to the emergency department for evaluation of posterior left upper leg pain that worsens with walking.  She did not injure herself in any way.  Patient is not experiencing low back pain.  No change in bowel or bladder function.  No saddle anesthesia.  No foot drop.  She has normal strength and sensation.  The area is not tender, no deformity.  She is able to raise her leg without difficulty, no straight leg raise reproduction of pain.  This appears to be muscular in nature.  Cannot rule out a mild sciatica, but examination does not support this.  She has no lower leg symptoms.  Calf is nontender.  Dorsalis pedal pulses palpable.  No overlying skin changes.  Patient will be treated for musculoskeletal pain, likely hamstring strain.        Final Clinical Impression(s) / ED Diagnoses Final diagnoses:  Hamstring strain, left, initial encounter    Rx / DC Orders ED Discharge Orders          Ordered    meloxicam (MOBIC) 7.5 MG tablet  Daily        07/08/21 0210              Orpah Greek, MD 07/08/21 0210

## 2021-07-08 NOTE — ED Triage Notes (Addendum)
Pt here from home with c/o left posterior thigh pain that shoots down leg. Pt ambulatory from [facility] to room 19. Steady gait with limp noted.

## 2023-07-02 DIAGNOSIS — N3001 Acute cystitis with hematuria: Secondary | ICD-10-CM | POA: Diagnosis not present
